# Patient Record
Sex: Female | Born: 1940 | Race: White | Hispanic: No | Smoking: Never smoker
Health system: Southern US, Community
[De-identification: ages and names within clinical notes are randomized; demographics above are authoritative.]

## PROBLEM LIST (undated history)

## (undated) DIAGNOSIS — E119 Type 2 diabetes mellitus without complications: Secondary | ICD-10-CM

## (undated) DIAGNOSIS — E7800 Pure hypercholesterolemia, unspecified: Secondary | ICD-10-CM

## (undated) DIAGNOSIS — E079 Disorder of thyroid, unspecified: Secondary | ICD-10-CM

## (undated) DIAGNOSIS — I10 Essential (primary) hypertension: Secondary | ICD-10-CM

---

## 2019-09-15 ENCOUNTER — Emergency Department (HOSPITAL_BASED_OUTPATIENT_CLINIC_OR_DEPARTMENT_OTHER): Payer: Medicare HMO

## 2019-09-15 ENCOUNTER — Other Ambulatory Visit: Payer: Self-pay

## 2019-09-15 ENCOUNTER — Encounter (HOSPITAL_BASED_OUTPATIENT_CLINIC_OR_DEPARTMENT_OTHER): Payer: Self-pay

## 2019-09-15 ENCOUNTER — Emergency Department (HOSPITAL_BASED_OUTPATIENT_CLINIC_OR_DEPARTMENT_OTHER)
Admission: EM | Admit: 2019-09-15 | Discharge: 2019-09-15 | Disposition: A | Discharge status: Home / Self Care | Payer: Medicare HMO | Attending: Emergency Medicine | Admitting: Emergency Medicine

## 2019-09-15 DIAGNOSIS — R0602 Shortness of breath: Secondary | ICD-10-CM | POA: Insufficient documentation

## 2019-09-15 DIAGNOSIS — E119 Type 2 diabetes mellitus without complications: Secondary | ICD-10-CM | POA: Diagnosis not present

## 2019-09-15 DIAGNOSIS — M7989 Other specified soft tissue disorders: Secondary | ICD-10-CM

## 2019-09-15 DIAGNOSIS — I10 Essential (primary) hypertension: Secondary | ICD-10-CM | POA: Diagnosis not present

## 2019-09-15 DIAGNOSIS — R2243 Localized swelling, mass and lump, lower limb, bilateral: Secondary | ICD-10-CM | POA: Insufficient documentation

## 2019-09-15 DIAGNOSIS — R600 Localized edema: Secondary | ICD-10-CM

## 2019-09-15 HISTORY — DX: Type 2 diabetes mellitus without complications: E11.9

## 2019-09-15 HISTORY — DX: Disorder of thyroid, unspecified: E07.9

## 2019-09-15 HISTORY — DX: Pure hypercholesterolemia, unspecified: E78.00

## 2019-09-15 HISTORY — DX: Essential (primary) hypertension: I10

## 2019-09-15 LAB — COMPREHENSIVE METABOLIC PANEL
ALT: 17 U/L (ref 0–44)
AST: 29 U/L (ref 15–41)
Albumin: 4 g/dL (ref 3.5–5.0)
Alkaline Phosphatase: 60 U/L (ref 38–126)
Anion gap: 14 (ref 5–15)
BUN: 17 mg/dL (ref 8–23)
CO2: 28 mmol/L (ref 22–32)
Calcium: 9.7 mg/dL (ref 8.9–10.3)
Chloride: 93 mmol/L — ABNORMAL LOW (ref 98–111)
Creatinine, Ser: 1.17 mg/dL — ABNORMAL HIGH (ref 0.44–1.00)
GFR calc Af Amer: 51 mL/min — ABNORMAL LOW (ref >60)
GFR calc non Af Amer: 44 mL/min — ABNORMAL LOW (ref >60)
Glucose, Bld: 172 mg/dL — ABNORMAL HIGH (ref 70–99)
Potassium: 3.3 mmol/L — ABNORMAL LOW (ref 3.5–5.1)
Sodium: 135 mmol/L (ref 135–145)
Total Bilirubin: 1 mg/dL (ref 0.3–1.2)
Total Protein: 7.7 g/dL (ref 6.5–8.1)

## 2019-09-15 LAB — CBC WITH DIFFERENTIAL/PLATELET
Abs Immature Granulocytes: 0.03 10*3/uL (ref 0.00–0.07)
Basophils Absolute: 0.1 10*3/uL (ref 0.0–0.1)
Basophils Relative: 1 %
Eosinophils Absolute: 0.5 10*3/uL (ref 0.0–0.5)
Eosinophils Relative: 6 %
HCT: 42.1 % (ref 36.0–46.0)
Hemoglobin: 14.2 g/dL (ref 12.0–15.0)
Immature Granulocytes: 0 %
Lymphocytes Relative: 28 %
Lymphs Abs: 2.2 10*3/uL (ref 0.7–4.0)
MCH: 30.9 pg (ref 26.0–34.0)
MCHC: 33.7 g/dL (ref 30.0–36.0)
MCV: 91.5 fL (ref 80.0–100.0)
Monocytes Absolute: 0.9 10*3/uL (ref 0.1–1.0)
Monocytes Relative: 11 %
Neutro Abs: 4.4 10*3/uL (ref 1.7–7.7)
Neutrophils Relative %: 54 %
Platelets: 307 10*3/uL (ref 150–400)
RBC: 4.6 MIL/uL (ref 3.87–5.11)
RDW: 13.1 % (ref 11.5–15.5)
WBC: 8 10*3/uL (ref 4.0–10.5)
nRBC: 0 % (ref 0.0–0.2)

## 2019-09-15 LAB — BRAIN NATRIURETIC PEPTIDE: B Natriuretic Peptide: 69.2 pg/mL (ref 0.0–100.0)

## 2019-09-15 MED ORDER — IOHEXOL 350 MG/ML SOLN
80.0000 mL | Freq: Once | INTRAVENOUS | Status: AC | PRN
  Administered 2019-09-15: 80 mL via INTRAVENOUS

## 2019-09-15 NOTE — ED Provider Notes (Signed)
[facility] EMERGENCY DEPARTMENT Provider Note   CSN: [id number] Arrival date & time: 09/15/19  1909     History Chief Complaint  Patient presents with  . Leg Swelling    Melinda Skinner is a 79 y.o. female.  79 y.o female with a PMH of DM, HTN, Thyroid disease presents to the ED sent in by urgent care for bilateral leg swelling for the past week.  According to patient's daughter, she noted her mother's swelling and her feet about a week ago, she reports she has not had pain to the area however has noted worsening swelling.Patient does report being treated for bronchitis in the past two weeks, felt short of breath but attribute this to her bronchitis. She was evaluated at Urgent Care but was sent to the ED for further evaluation due to elevated D-dimer. She denies any fever, orthopnea, pain to the calf region BL, or chest pain. No prior history of blood clots, no recent immobilization.   The history is provided by the patient.       Past Medical History:  Diagnosis Date  . Diabetes mellitus without complication (HCC)   . High cholesterol   . Hypertension   . Thyroid disease     There are no problems to display for this patient.   History reviewed. No pertinent surgical history.   OB History   No obstetric history on file.     No family history on file.  Social History   Tobacco Use  . Smoking status: Never Smoker  . Smokeless tobacco: Never Used  Substance Use Topics  . Alcohol use: Yes    Comment: rare  . Drug use: Never    Home Medications Prior to Admission medications   Not on File    Allergies    Patient has no known allergies.  Review of Systems   Review of Systems  Constitutional: Negative for fever.  HENT: Negative for sore throat.   Respiratory: Positive for shortness of breath (attributes to bronchitis).   Cardiovascular: Positive for leg swelling (BL). Negative for chest pain.  Gastrointestinal: Negative for abdominal pain,  nausea and vomiting.  Genitourinary: Negative for flank pain.  Musculoskeletal: Negative for back pain.  Skin: Negative for pallor and wound.  Neurological: Negative for light-headedness and headaches.  All other systems reviewed and are negative.   Physical Exam Updated Vital Signs BP (!) 178/72 (BP Location: Right Arm)   Pulse 89   Temp 98.2 F (36.8 C) (Oral)   Resp (!) 21   Ht 5\' 5"  (1.651 m)   Wt 78.9 kg   SpO2 94%   BMI 28.96 kg/m   Physical Exam Vitals and nursing note reviewed.  Constitutional:      Appearance: Normal appearance.  HENT:     Head: Normocephalic and atraumatic.     Nose: Nose normal.     Mouth/Throat:     Mouth: Mucous membranes are moist.  Eyes:     Pupils: Pupils are equal, round, and reactive to light.  Cardiovascular:     Pulses:          Dorsalis pedis pulses are 2+ on the right side and 2+ on the left side.       Posterior tibial pulses are 2+ on the right side and 2+ on the left side.     Comments: No calf tenderness BL, 1+ pitting edema BL.  Pulmonary:     Effort: Pulmonary effort is normal.     Breath sounds:  No stridor. Examination of the right-lower field reveals decreased breath sounds. Examination of the left-lower field reveals decreased breath sounds. Decreased breath sounds present.  Abdominal:     General: Abdomen is flat.     Tenderness: There is no abdominal tenderness. There is no right CVA tenderness or left CVA tenderness.  Musculoskeletal:     Right lower leg: 1+ Edema present.     Left lower leg: 1+ Edema present.  Neurological:     Mental Status: She is alert.     ED Results / Procedures / Treatments   Labs (all labs ordered are listed, but only abnormal results are displayed) Labs Reviewed  COMPREHENSIVE METABOLIC PANEL - Abnormal; Notable for the following components:      Result Value   Potassium 3.3 (*)    Chloride 93 (*)    Glucose, Bld 172 (*)    Creatinine, Ser 1.17 (*)    GFR calc non Af Amer 44 (*)      GFR calc Af Amer 51 (*)    All other components within normal limits  CBC WITH DIFFERENTIAL/PLATELET  BRAIN NATRIURETIC PEPTIDE    EKG EKG Interpretation  Date/Time:  Wednesday September 15 2019 22:33:23 EDT Ventricular Rate:  91 PR Interval:    QRS Duration: 95 QT Interval:  399 QTC Calculation: 491 R Axis:   -36 Text Interpretation: Sinus rhythm Atrial premature complex Prolonged PR interval Consider left atrial enlargement Left axis deviation Borderline prolonged QT interval Confirmed by Virgina Norfolk [phone]) on 09/15/2019 10:37:38 PM   Radiology DG Chest 2 View  Result Date: 09/15/2019 CLINICAL DATA:  Bilateral lower extremity edema for 1 week, elevated D-dimer EXAM: CHEST - 2 VIEW COMPARISON:  09/01/2019 FINDINGS: Frontal and lateral views of the chest demonstrate a stable cardiac silhouette. Chronic bronchovascular prominence without airspace disease, effusion, or pneumothorax. No acute bony abnormalities. IMPRESSION: 1. No acute intrathoracic process. Electronically Signed   By: Sharlet Salina M.D.   On: 09/15/2019 19:52    Procedures Procedures (including critical care time)  Medications Ordered in ED Medications  iohexol (OMNIPAQUE) 350 MG/ML injection 80 mL (80 mLs Intravenous Contrast Given 09/15/19 2222)    ED Course  I have reviewed the triage vital signs and the nursing notes.  Pertinent labs & imaging results that were available during my care of the patient were reviewed by me and considered in my medical decision making (see chart for details).    MDM Rules/Calculators/A&P   Patient with a past medical history of diabetes presents to the ED with bilateral leg swelling for the past week.  Signs were noted by daughter who is at the bedside waiting most of the history.  Patient was evaluated today at [facility] urgent care, had basic blood work drawn along with a D-dimer level which resulted at 1, 090.  She was referred to [facility] in order to further  evaluate her elevated D-dimer.  Patient reports she did not know her legs were swollen for the past week.  She has been recently been treated for bronchitis, states she was attributing her shortness of breath to these symptoms.  According to her chart which I extensively review, she was treated for bronchitis on 10 March.  She does not have any prior history of heart failure, no orthopnea, fevers, coughs.  During my evaluation patient's lungs are slightly diminished to auscultation of bilateral lower fields, abdomen appears somewhat distended but not tender to palpation.  Legs do have 1+ pitting edema  bilaterally, no calf tenderness noted, no erythema or changes in the skin consistent with cellulitis.  Have reviewed patient's blood work from urgent care with a normal creatinine function and normal LFTs, no signs of infection such as an elevated white count.  She arrived in the ED afebrile, oxygen saturations 95% on room air, heart rate is around 95.  She does not have any prior history of blood clots, recent immobilizations, history of cancer.  Some suspicion for heart failure, although no prior history of this.  Oxygen saturation around 95%, will obtain chest x-ray, some suspicion for pulmonary embolism due to slight elevation in heart rate.  Have discussed repeating blood work on today's visit along with adding a BNP.  Patient and daughter at the bedside agreeable of this.  Irritation of her lab reveals a CBC without any leukocytosis, no signs of anemia.  CMP with some mild hypokalemia although within her baseline.  She is got a mild patient and her creatinine at 1.1 from a less than 1.  No patient's of her LFT.  BNP is within normal limits.  Chest x-ray did not show any signs of pulmonary edema, consolidation.  Ultrasound of her bilateral lower extremities have been order, patient has been monitor on cardiac strip while in the ED, oxygen saturation is waxing and waning between 83% to 95%, heart rate thus  get elevated to the lower 90s, discussed risks and benefits of obtaining a CT imaging to rule out pulmonary embolism.  Patient agreeable with proceeding with plan.  Patient CARE signed onto Dr. Ronnald Nian pending CT chest and DVT study.   Portions of this note were generated with [employer]. Dictation errors may occur despite best attempts at proofreading.  Final Clinical Impression(s) / ED Diagnoses Final diagnoses:  Bilateral lower extremity edema    Rx / DC Orders ED Discharge Orders    None       Janeece Fitting, PA-C 09/15/19 [place], [place], DO 09/15/19 2246

## 2019-09-15 NOTE — ED Triage Notes (Signed)
Pt c/o bilat leg swelling x 1 week-seen at [facility] today-daughter was called and advised to bring pt to ED due to "ddimer was 1090"

## 2019-09-15 NOTE — Discharge Instructions (Addendum)
Continue amlodipine.  Take Lasix/furosemide for 2 to 3 days.  Follow-up with your primary care doctor to discuss need for ultrasound of your gallbladder and liver as your gallbladder wall was thicken, echocardiogram of your heart to further evaluate for heart failure, and CT scan of your chest to reevaluate pulmonary nodules (in 6 months).  You will also need your kidney function rechecked.

## 2019-09-15 NOTE — ED Provider Notes (Signed)
Medical screening examination/treatment/procedure(s) were conducted as a shared visit with non-physician practitioner(s) and myself.  I personally evaluated the patient during the encounter. Briefly, the patient is a 79 y.o. female with history of diabetes, hypertension, high cholesterol presents the ED with leg swelling.  Had elevated D-dimer and was sent for possible DVT study.  Patient with overall unremarkable vitals.  Has 1+ pitting edema bilaterally in her legs.  No history of heart failure, liver disease, kidney disease.  Leg swelling has been ongoing for about a week.  Does get better in the morning when she lies flat.  Gets worse when she is on her feet for long time.  She has good pulses in her feet.  Lab work showed mild elevation in creatinine.  Possibly leg swelling is secondary to some renal disease.  BNP was normal.  Chest x-ray showed no signs of infection, no edema.  Less likely heart failure.  Will obtain DVT study, PE study to rule out clot.  She has no risk factors for this.  Does not have any chest pain.  Overall is well-appearing.  Patient with no acute PE, no DVT.  However multiple incidental findings.  She appears to have a severely diffuse gallbladder wall thickening but she does not have any abdominal pain.  Bilirubin and liver enzymes are normal.  No concern for acute gallbladder process.  We will have her follow-up for outpatient ultrasound.  Patient also with multiple pulmonary nodules and will have her follow-up for repeat chest CT in 6 months.  No history of smoking.  Patient also had mild cardiomegaly and would benefit from an outpatient echocardiogram as well.  Patient overall likely with swelling in her legs from some mild CKD and mild heart failure.  She was prescribed fluid pill at urgent care today which I think is reasonable to take for a few days.  She has follow-up with primary care provider in 2 weeks.  Given return precautions.  This chart was dictated using voice  recognition software.  Despite best efforts to proofread,  errors can occur which can change the documentation meaning.     EKG Interpretation  Date/Time:  Wednesday September 15 2019 22:33:23 EDT Ventricular Rate:  91 PR Interval:    QRS Duration: 95 QT Interval:  399 QTC Calculation: 491 R Axis:   -36 Text Interpretation: Sinus rhythm Atrial premature complex Prolonged PR interval Consider left atrial enlargement Left axis deviation Borderline prolonged QT interval Confirmed by Virgina Norfolk [phone]) on 09/15/2019 10:37:38 PM           Virgina Norfolk, DO 09/15/19 2309

## 2021-06-24 IMAGING — CR DG CHEST 2V
2 series · 2 of 2 positions shown · non-contrast
Comparison: 09/01/2019

CLINICAL DATA: Bilateral lower extremity edema for 1 week, elevated
D-dimer

EXAM:
CHEST - 2 VIEW

[w chest pa]
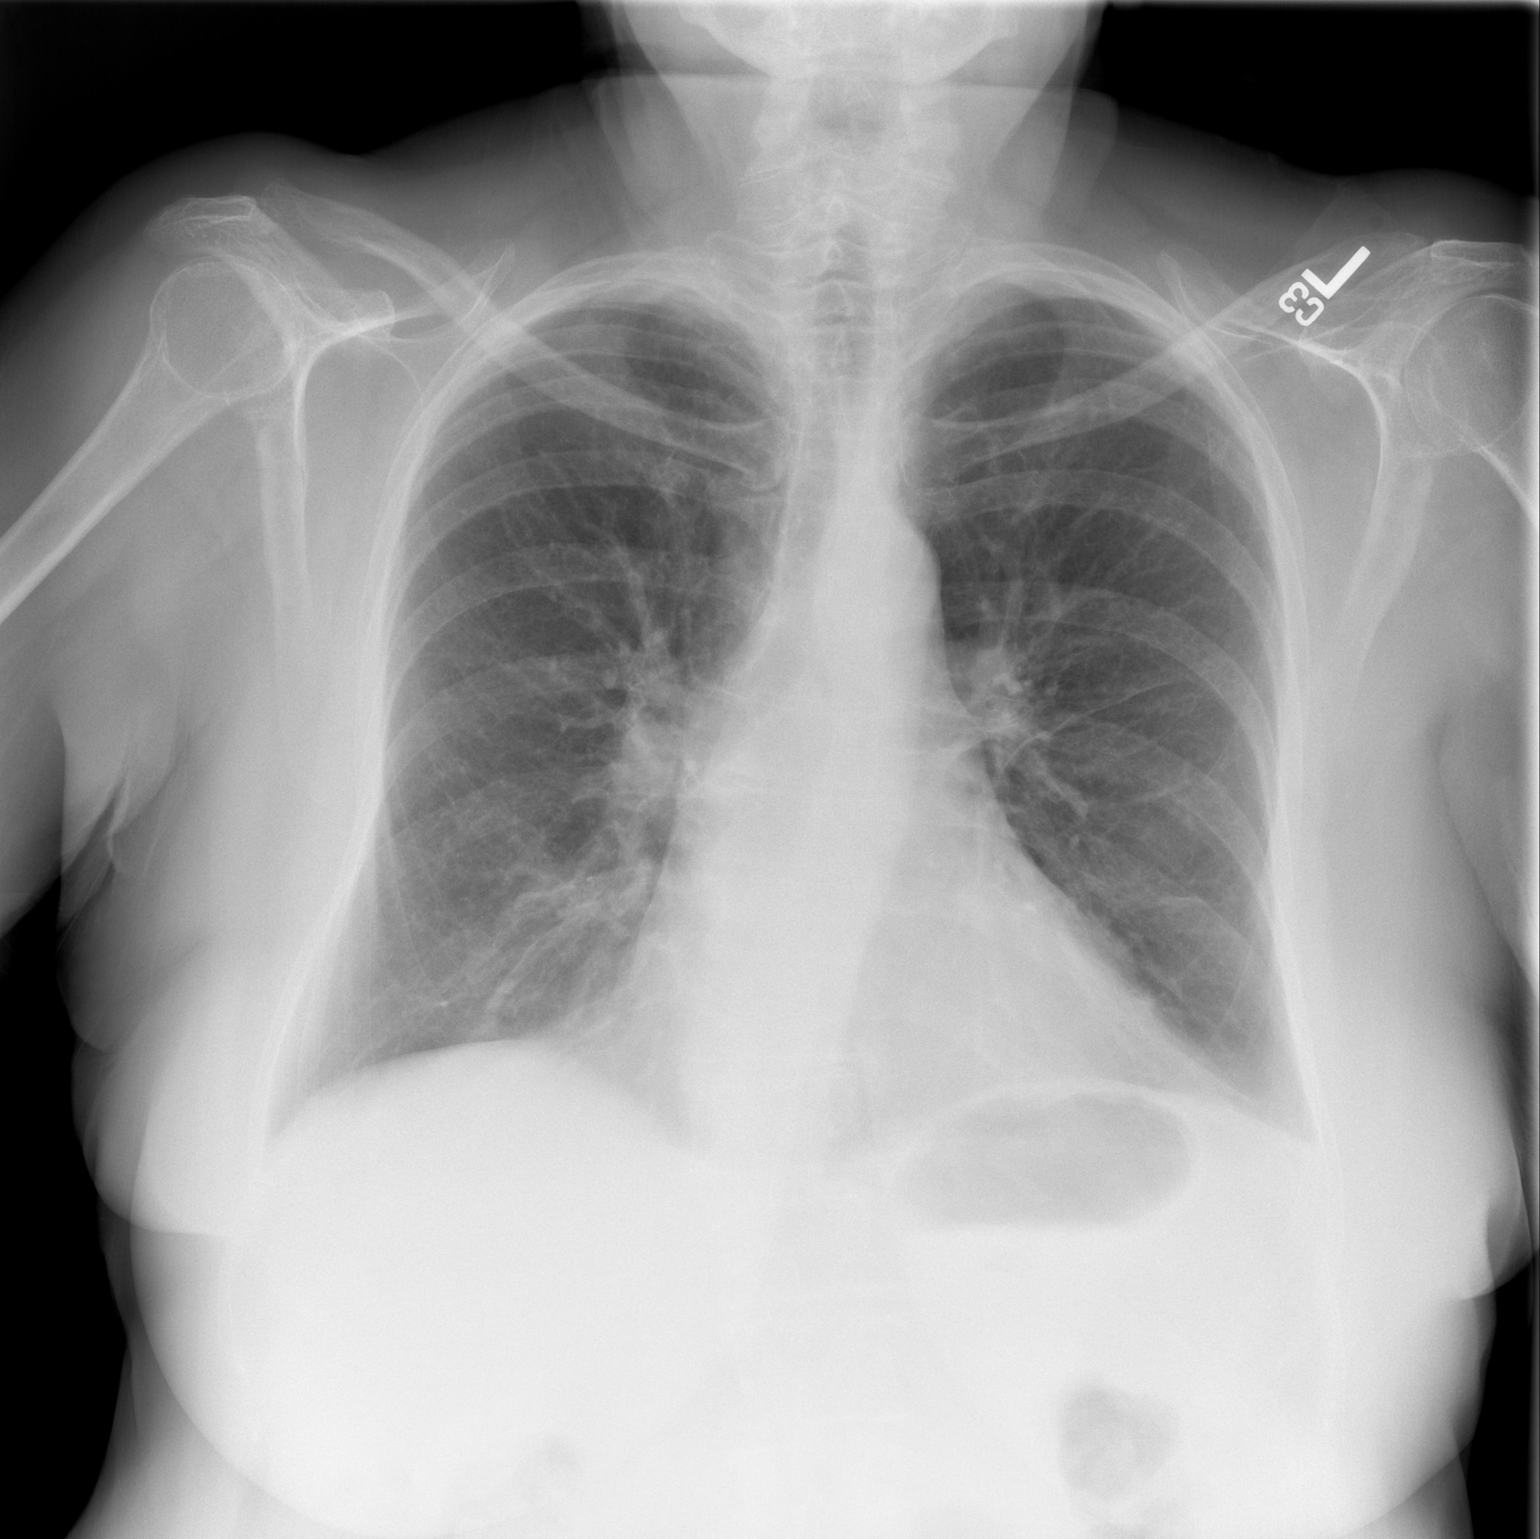

[w chest lat]
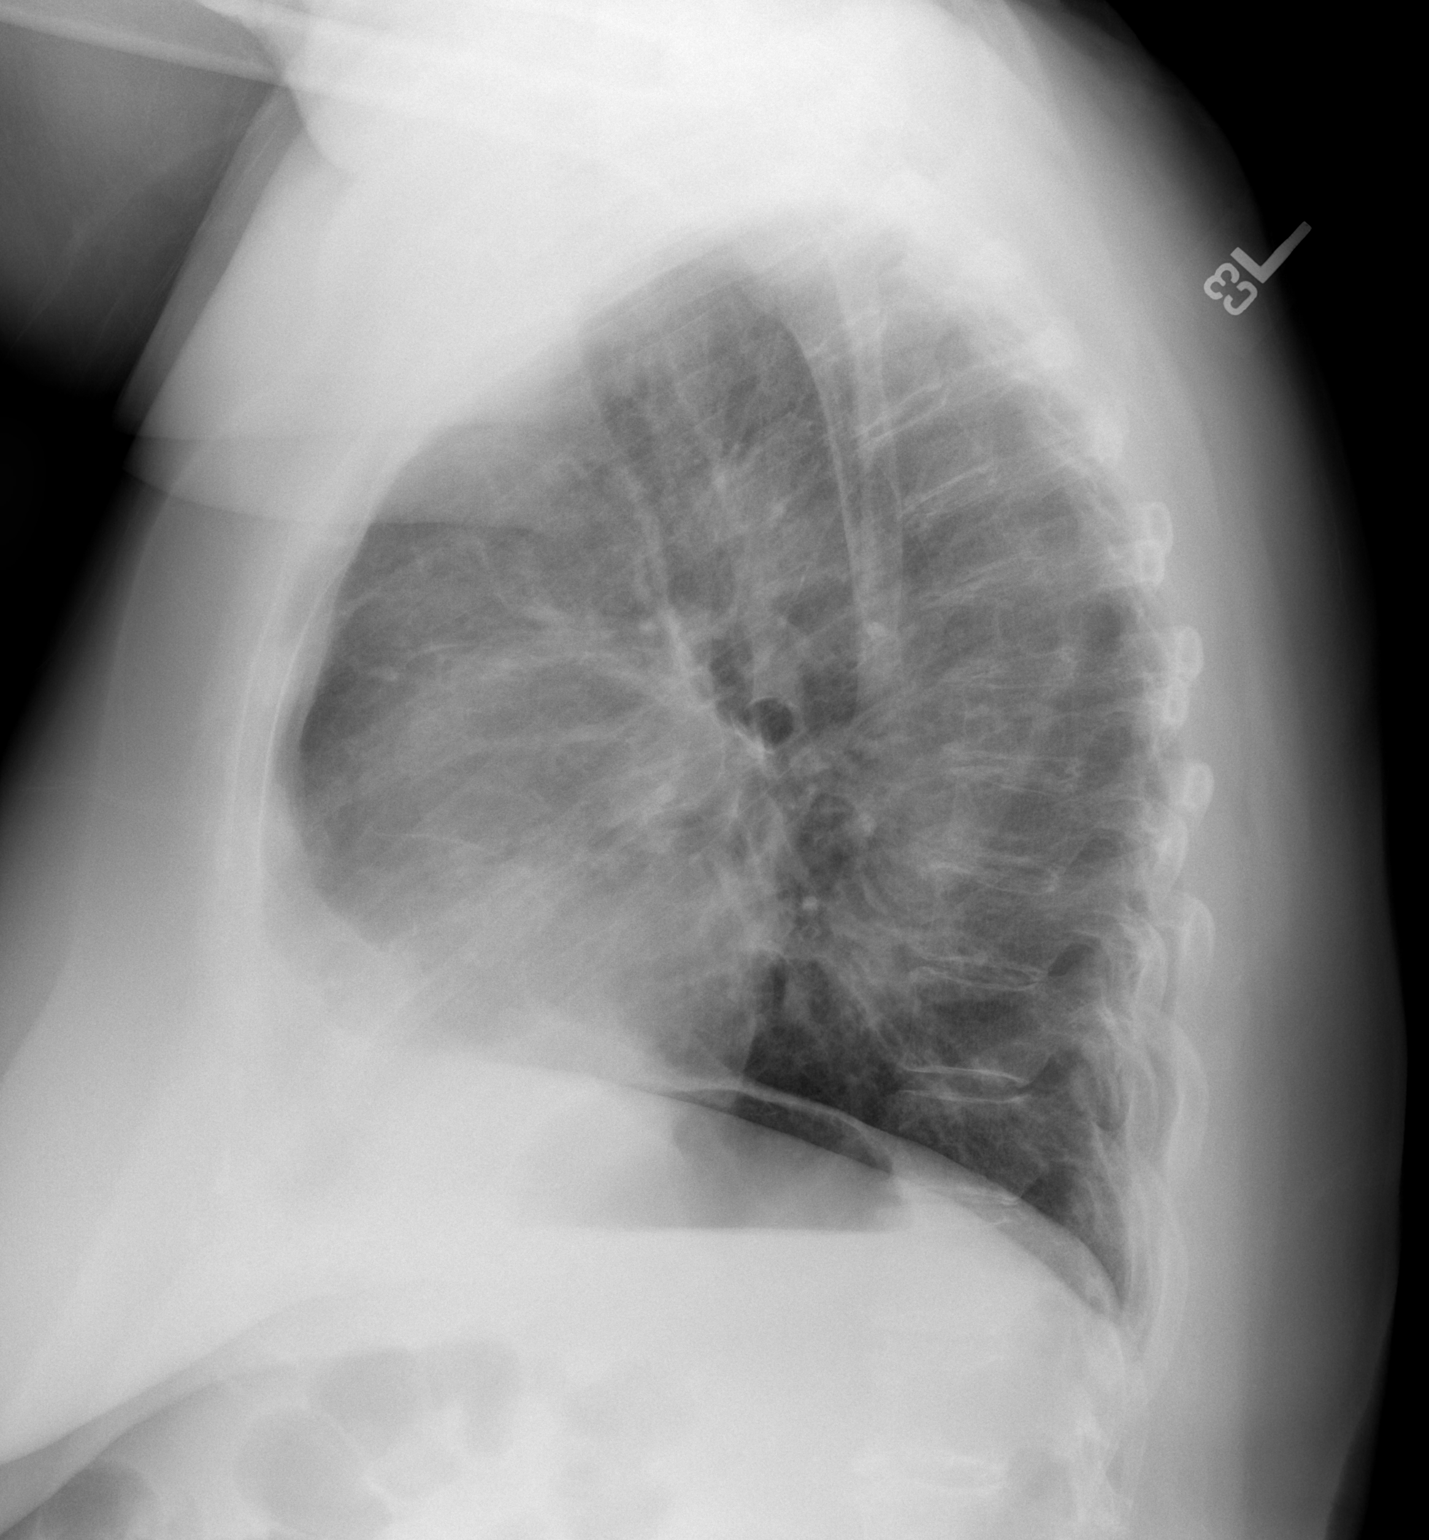

[2 of 2 positions shown; findings below may reference images not displayed]

FINDINGS: Frontal and lateral views of the chest demonstrate a stable cardiac
silhouette. Chronic bronchovascular prominence without airspace
disease, effusion, or pneumothorax. No acute bony abnormalities.
IMPRESSION: 1. No acute intrathoracic process.

## 2021-06-24 IMAGING — US US EXTREM LOW VENOUS
1 series · 13 of 24 positions shown · non-contrast
Comparison: None.

CLINICAL DATA: [DATE]-year-old female with bilateral lower extremity
swelling x2 weeks.



[Series 1: us extrem low venous · 60 acquisitions, 13 frames shown]
[im 1/60]
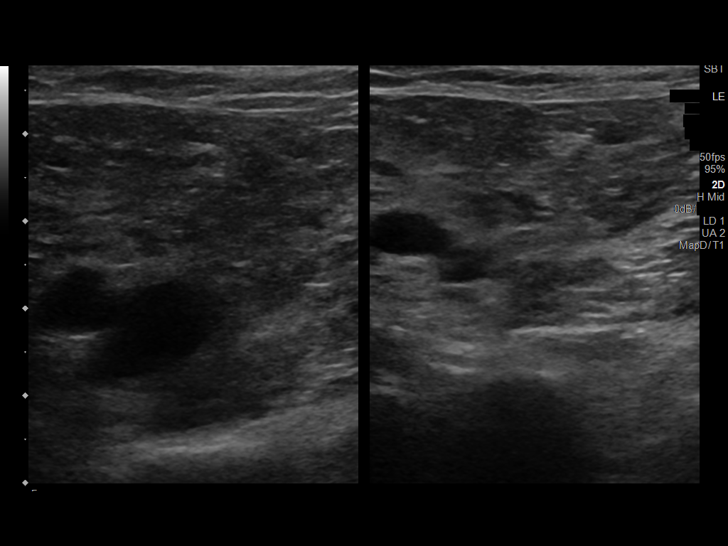
[im 6/60]
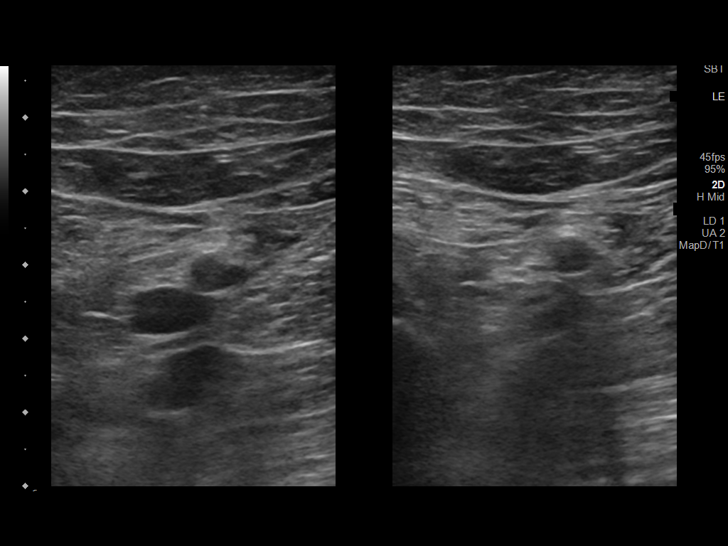
[im 11/60]
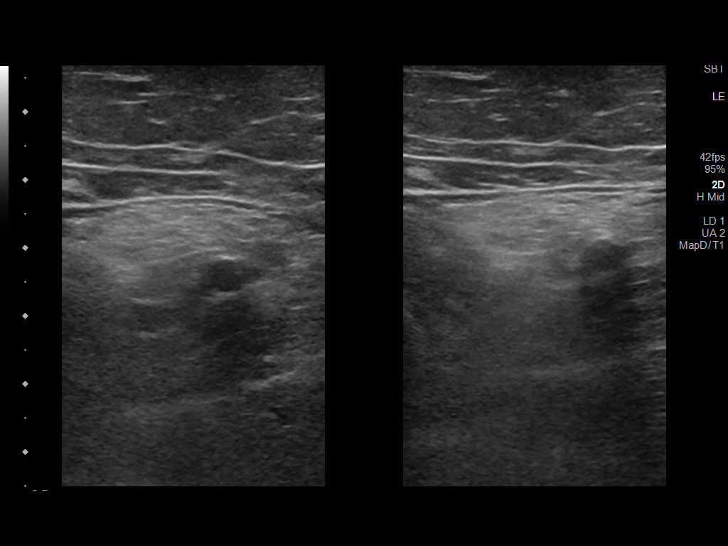
[im 16/60]
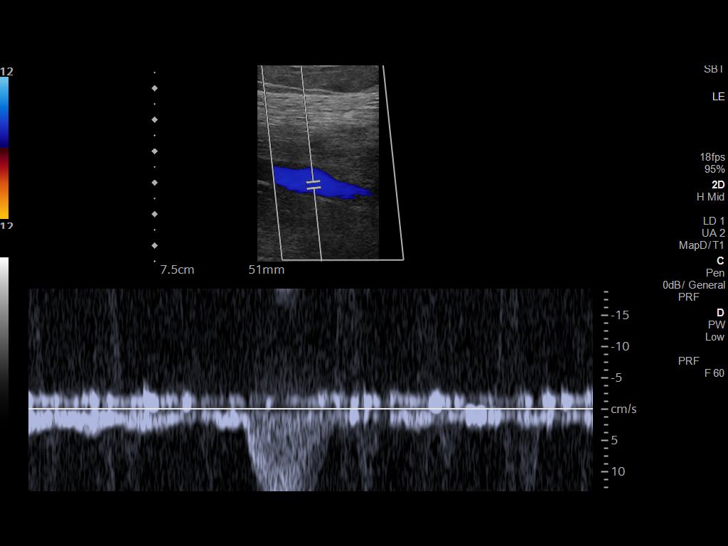
[im 21/60]
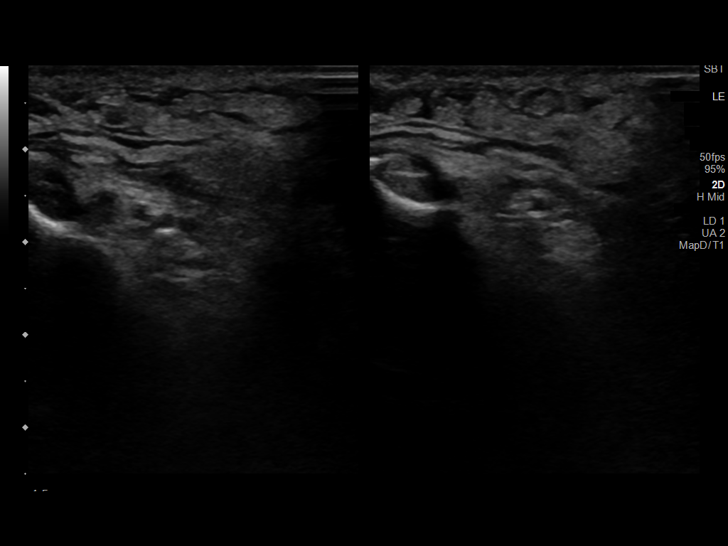
[im 26/60]
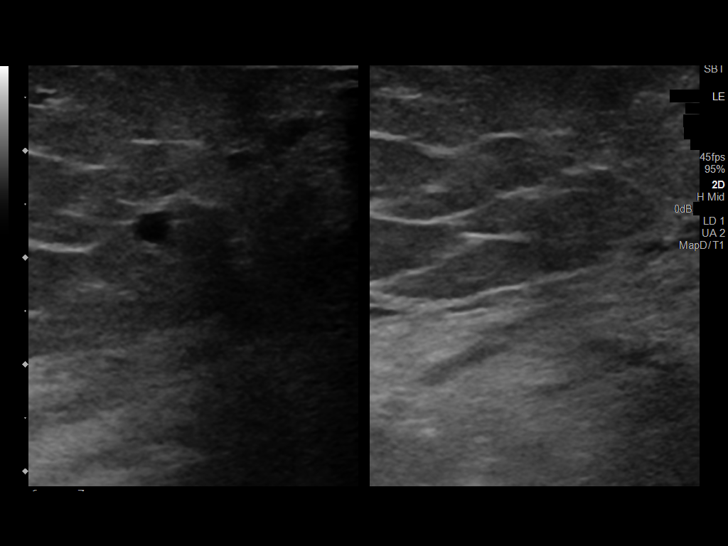
[im 34/60]
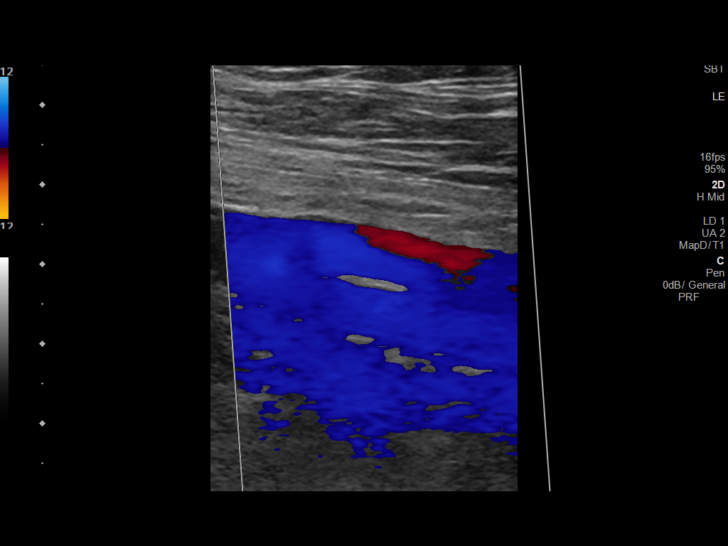
[im 36/60]
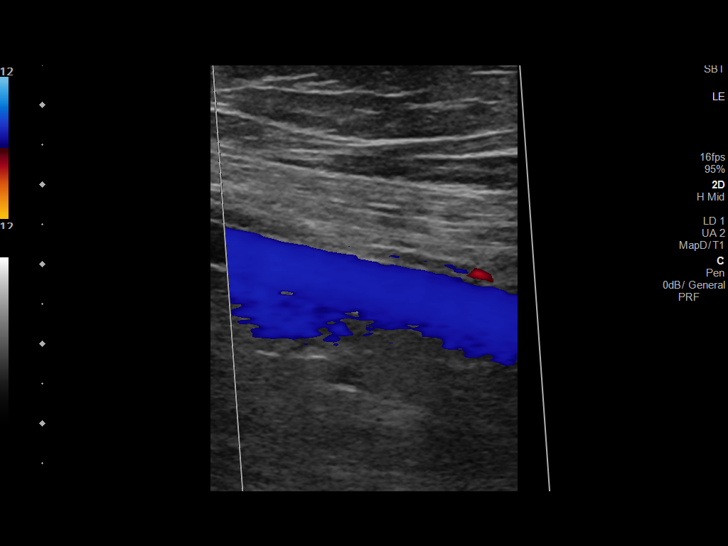
[im 42/60]
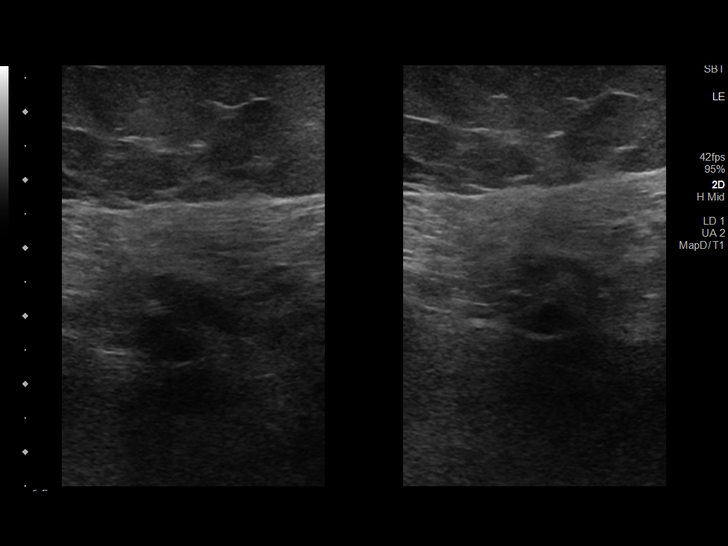
[im 47/60]
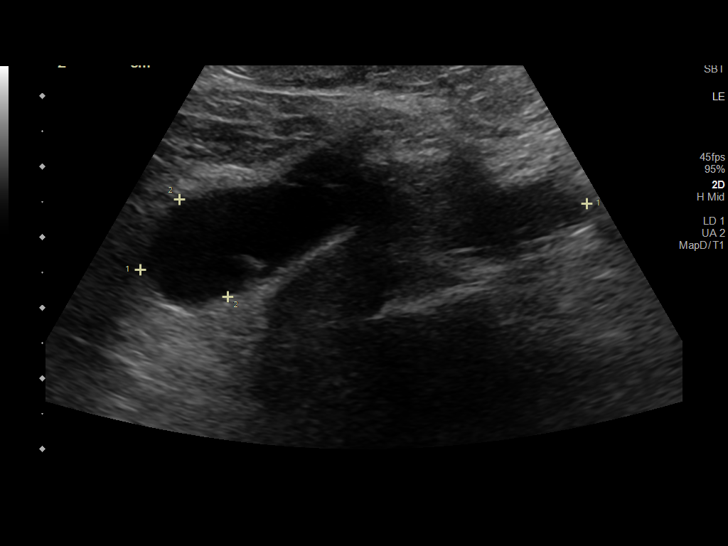
[im 52/60]
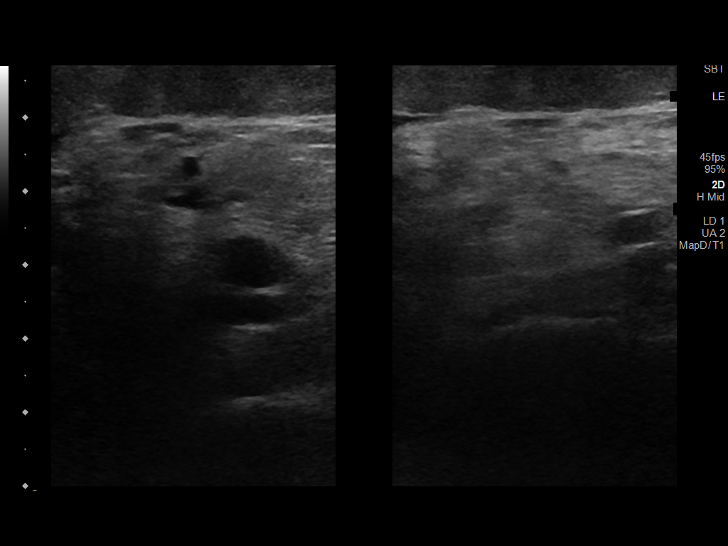
[im 54/60]
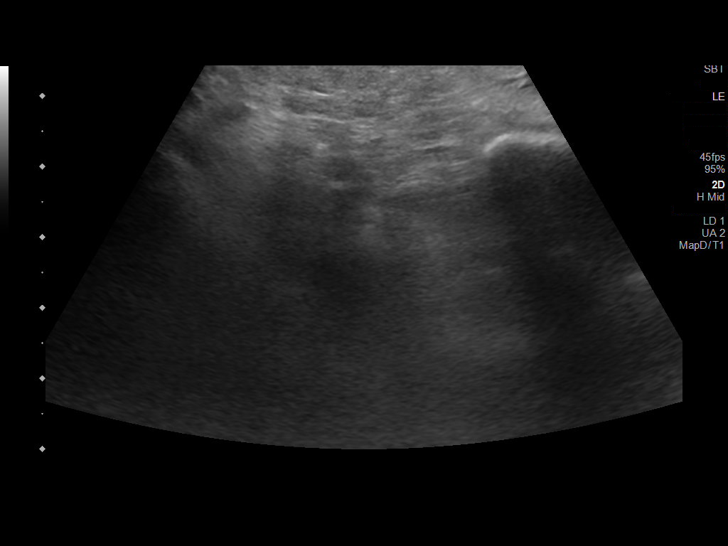
[im 60/60]
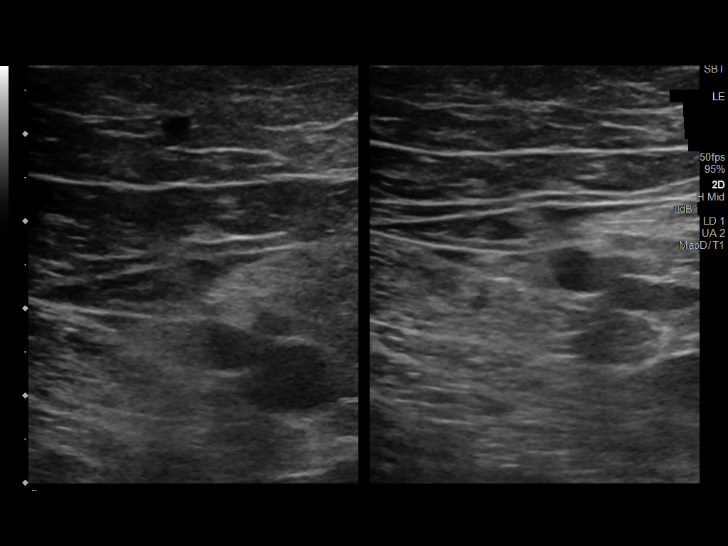

[13 of 24 positions shown; findings below may reference images not displayed]

FINDINGS: RIGHT LOWER EXTREMITY

Common Femoral Vein: No evidence of thrombus. Normal
compressibility, respiratory phasicity and response to augmentation.

Saphenofemoral Junction: No evidence of thrombus. Normal
compressibility and flow on color Doppler imaging.

Profunda Femoral Vein: No evidence of thrombus. Normal
compressibility and flow on color Doppler imaging.

Femoral Vein: No evidence of thrombus. Normal compressibility,
respiratory phasicity and response to augmentation.

Popliteal Vein: No evidence of thrombus. Normal compressibility,
respiratory phasicity and response to augmentation.

Calf Veins: The calf veins are poorly visualized and not evaluated.

Superficial Great Saphenous Vein: No evidence of thrombus. Normal
compressibility.

Venous Reflux:  None.

Other Findings:  There is diffuse subcutaneous edema.

LEFT LOWER EXTREMITY

Common Femoral Vein: No evidence of thrombus. Normal
compressibility, respiratory phasicity and response to augmentation.

Saphenofemoral Junction: No evidence of thrombus. Normal
compressibility and flow on color Doppler imaging.

Profunda Femoral Vein: No evidence of thrombus. Normal
compressibility and flow on color Doppler imaging.

Femoral Vein: No evidence of thrombus. Normal compressibility,
respiratory phasicity and response to augmentation.

Popliteal Vein: No evidence of thrombus. Normal compressibility,
respiratory phasicity and response to augmentation.

Calf Veins: The calf veins are poorly visualized and not well
evaluated.

Superficial Great Saphenous Vein: No evidence of thrombus. Normal
compressibility.

Venous Reflux:  None.

Other Findings: There is a 6.4 x 1.5 x 3.2 cm left posterior fossa
minimally complex cysts, likely a Baker's cyst. There is diffuse
subcutaneous edema.
IMPRESSION: No evidence of deep venous thrombosis in either lower extremity.
